# Patient Record
Sex: Female | Born: 1975 | Race: Black or African American | Hispanic: No | Marital: Single | Smoking: Never smoker
Health system: Southern US, Community
[De-identification: ages and names within clinical notes are randomized; demographics above are authoritative.]

## PROBLEM LIST (undated history)

## (undated) DIAGNOSIS — E119 Type 2 diabetes mellitus without complications: Secondary | ICD-10-CM

## (undated) DIAGNOSIS — G510 Bell's palsy: Secondary | ICD-10-CM

## (undated) HISTORY — DX: Bell's palsy: G51.0

## (undated) HISTORY — PX: WISDOM TOOTH EXTRACTION: SHX21

## (undated) HISTORY — DX: Type 2 diabetes mellitus without complications: E11.9

---

## 1999-08-06 ENCOUNTER — Other Ambulatory Visit: Admission: RE | Admit: 1999-08-06 | Discharge: 1999-08-06 | Payer: Self-pay | Admitting: Obstetrics and Gynecology

## 2000-08-18 ENCOUNTER — Other Ambulatory Visit: Admission: RE | Admit: 2000-08-18 | Discharge: 2000-08-18 | Payer: Self-pay | Admitting: Obstetrics and Gynecology

## 2001-04-24 DIAGNOSIS — G510 Bell's palsy: Secondary | ICD-10-CM

## 2001-04-24 HISTORY — DX: Bell's palsy: G51.0

## 2001-10-04 ENCOUNTER — Other Ambulatory Visit: Admission: RE | Admit: 2001-10-04 | Discharge: 2001-10-04 | Payer: Self-pay | Admitting: Obstetrics and Gynecology

## 2002-10-08 ENCOUNTER — Other Ambulatory Visit: Admission: RE | Admit: 2002-10-08 | Discharge: 2002-10-08 | Payer: Self-pay | Admitting: Obstetrics and Gynecology

## 2003-08-25 ENCOUNTER — Other Ambulatory Visit: Admission: RE | Admit: 2003-08-25 | Discharge: 2003-08-25 | Payer: Self-pay | Admitting: Obstetrics and Gynecology

## 2004-04-27 ENCOUNTER — Other Ambulatory Visit: Admission: RE | Admit: 2004-04-27 | Discharge: 2004-04-27 | Payer: Self-pay | Admitting: Obstetrics and Gynecology

## 2004-06-14 ENCOUNTER — Other Ambulatory Visit: Admission: RE | Admit: 2004-06-14 | Discharge: 2004-06-14 | Payer: Self-pay | Admitting: Obstetrics and Gynecology

## 2004-11-04 ENCOUNTER — Other Ambulatory Visit: Admission: RE | Admit: 2004-11-04 | Discharge: 2004-11-04 | Payer: Self-pay | Admitting: Obstetrics and Gynecology

## 2005-06-28 ENCOUNTER — Other Ambulatory Visit: Admission: RE | Admit: 2005-06-28 | Discharge: 2005-06-28 | Payer: Self-pay | Admitting: Obstetrics and Gynecology

## 2005-08-04 ENCOUNTER — Emergency Department (HOSPITAL_COMMUNITY): Admission: EM | Admit: 2005-08-04 | Discharge: 2005-08-04 | Payer: Self-pay | Admitting: Emergency Medicine

## 2006-02-07 ENCOUNTER — Ambulatory Visit: Payer: Self-pay | Admitting: Cardiovascular Disease

## 2006-08-22 ENCOUNTER — Other Ambulatory Visit: Admission: RE | Admit: 2006-08-22 | Discharge: 2006-08-22 | Payer: Self-pay | Admitting: Obstetrics & Gynecology

## 2006-08-25 ENCOUNTER — Encounter: Admission: RE | Admit: 2006-08-25 | Discharge: 2006-08-25 | Payer: Self-pay | Admitting: Obstetrics and Gynecology

## 2007-08-23 ENCOUNTER — Other Ambulatory Visit: Admission: RE | Admit: 2007-08-23 | Discharge: 2007-08-23 | Payer: Self-pay | Admitting: Obstetrics and Gynecology
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
## 2010-06-11 NOTE — Procedures (Signed)
[place] [facility]                              EXERCISE TREADMILL   NAME:Katrina Hanson, Katrina Hanson                      MRN:          [id number]  DATE:02/07/2006                            DOB:          [birth year]    Patient exercised 6 minutes on the Bruce protocol.  The first stage was  advanced.   Maximum heart rate was 164, peak blood pressure was 133/78.   Resting EKG was normal with stress.  There was no evidence of ischemia,  no significant arrhythmias.   IMPRESSION:  Negative exercise stress test at a modest workload.   Patient would not appear to have any significant structural heart  disease or evidence for coronary artery disease.     Noralyn Pick. Eden Emms, MD, [facility]  Electronically Signed    PCN/MedQ  DD: 02/07/2006  DT: 02/07/2006  Job #: [id number]

## 2010-12-28 DIAGNOSIS — J111 Influenza due to unidentified influenza virus with other respiratory manifestations: Secondary | ICD-10-CM | POA: Insufficient documentation

## 2011-07-25 DIAGNOSIS — I8390 Asymptomatic varicose veins of unspecified lower extremity: Secondary | ICD-10-CM | POA: Insufficient documentation

## 2012-05-18 ENCOUNTER — Ambulatory Visit (INDEPENDENT_AMBULATORY_CARE_PROVIDER_SITE_OTHER): Payer: BC Managed Care – PPO

## 2012-05-18 DIAGNOSIS — E559 Vitamin D deficiency, unspecified: Secondary | ICD-10-CM

## 2012-12-06 ENCOUNTER — Encounter: Payer: Self-pay | Admitting: Obstetrics and Gynecology

## 2012-12-06 ENCOUNTER — Encounter: Payer: Self-pay | Admitting: Nurse Practitioner

## 2012-12-06 ENCOUNTER — Ambulatory Visit (INDEPENDENT_AMBULATORY_CARE_PROVIDER_SITE_OTHER): Payer: BC Managed Care – PPO | Admitting: Nurse Practitioner

## 2012-12-06 VITALS — BP 124/82 | HR 68 | Resp 16 | Ht 67.5 in | Wt 218.0 lb

## 2012-12-06 DIAGNOSIS — Z01419 Encounter for gynecological examination (general) (routine) without abnormal findings: Secondary | ICD-10-CM

## 2012-12-06 DIAGNOSIS — Z113 Encounter for screening for infections with a predominantly sexual mode of transmission: Secondary | ICD-10-CM

## 2012-12-06 DIAGNOSIS — E559 Vitamin D deficiency, unspecified: Secondary | ICD-10-CM

## 2012-12-06 DIAGNOSIS — Z0000 Encounter for general adult medical examination without abnormal findings: Secondary | ICD-10-CM

## 2012-12-06 LAB — POCT URINALYSIS DIPSTICK
Nitrite, UA: NEGATIVE
Urobilinogen, UA: NEGATIVE
pH, UA: 7

## 2012-12-06 LAB — HEMOGLOBIN, FINGERSTICK: Hemoglobin, fingerstick: 12.9 g/dL (ref 12.0–16.0)

## 2012-12-06 MED ORDER — NORETHINDRONE 0.35 MG PO TABS: 1.0000 | ORAL_TABLET | Freq: Every day | ORAL | Status: DC

## 2012-12-06 NOTE — Progress Notes (Signed)
Patient ID: Katrina Hanson, female   DOB: 1975/04/12, 37 y.o.   MRN: [id number] 37 y.o. G1P1001 Divorced African American Fe here for annual exam.  She was on OCP until this past month.  She feels very bloated and has weight gain on most OCP.  She is still in same relationship but he lives in [place].  She wants birth control without side effects. Discussed other options.  Patient's last menstrual period was 11/25/2012.          Sexually active: yes  The current method of family planning is OCP (estrogen/progesterone).    Exercising: yes  Home and gym routine that includes jogging and weights at least 2 times per week Smoker:  no  Health Maintenance: Pap: 01/20/12, WNL MMG: 08/25/06, Diagnostic Bilat: Bi-Rads 1: negative TDaP: 2009 Labs: HB: 12.9 Urine: negative   reports that she has never smoked. She has never used smokeless tobacco. She reports that she drinks alcohol. She reports that she does not use illicit drugs.  Past Medical History  Diagnosis Date  . Bell's palsy 4/03  . Diabetes mellitus without complication     borderline    Past Surgical History  Procedure Laterality Date  . Cesarean section  8/95    breech  . Wisdom tooth extraction  age 37    Current Outpatient Prescriptions  Medication Sig Dispense Refill  . vitamin D, CHOLECALCIFEROL, 400 UNITS tablet Take 800 Units by mouth daily.      . norethindrone (MICRONOR,CAMILA,ERRIN) 0.35 MG tablet Take 1 tablet (0.35 mg total) by mouth daily.  3 Package  3   No current facility-administered medications for this visit.    Family History  Problem Relation Age of Onset  . Pancreatic cancer Mother   . Hypertension Father   . Stroke Maternal Grandmother     ROS:  Pertinent items are noted in HPI.  Otherwise, a comprehensive ROS was negative.  Exam:   BP 124/82  Pulse 68  Resp 16  Ht 5' 7.5" (1.715 m)  Wt 218 lb (98.884 kg)  BMI 33.62 kg/m2  LMP 11/25/2012 Height: 5' 7.5" (171.5 cm)  Ht Readings from Last  3 Encounters:  12/06/12 5' 7.5" (1.715 m)    General appearance: alert, cooperative and appears stated age Head: Normocephalic, without obvious abnormality, atraumatic Neck: no adenopathy, supple, symmetrical, trachea midline and thyroid normal to inspection and palpation Lungs: clear to auscultation bilaterally Breasts: normal appearance, no masses or tenderness Heart: regular rate and rhythm Abdomen: soft, non-tender; no masses,  no organomegaly Extremities: extremities normal, atraumatic, no cyanosis or edema Skin: Skin color, texture, turgor normal. No rashes or lesions Lymph nodes: Cervical, supraclavicular, and axillary nodes normal. No abnormal inguinal nodes palpated Neurologic: Grossly normal   Pelvic: External genitalia:  no lesions              Urethra:  normal appearing urethra with no masses, tenderness or lesions              Bartholin's and Skene's: normal                 Vagina: normal appearing vagina with normal color and discharge, no lesions              Cervix: anteverted              Pap taken: yes Bimanual Exam:  Uterus:  normal size, contour, position, consistency, mobility, non-tender  Adnexa: no mass, fullness, tenderness               Rectovaginal: Confirms               Anus:  normal sphincter tone, no lesions  A:  Well Woman with normal exam  R/O STD's  OCP with weight gain and bloating  History of menorrhagia  Vit D Deficiency  Borderline DM   P:   Pap smear as per guidelines done today   Will change to POP with Micronor - discussed use, BUM, and side effects.  Will follow with labs including Vit D  Counseled on breast self exam, adequate intake of calcium and vitamin D, diet and exercise return annually or prn  An After Visit Summary was printed and given to the patient.

## 2012-12-06 NOTE — Patient Instructions (Signed)

## 2012-12-08 LAB — STD PANEL: Hepatitis B Surface Ag: NEGATIVE

## 2012-12-09 NOTE — Progress Notes (Signed)
Encounter reviewed by Dr. Brook Silva.  

## 2012-12-11 LAB — IPS PAP TEST WITH HPV

## 2012-12-11 LAB — IPS N GONORRHOEA AND CHLAMYDIA BY PCR

## 2012-12-17 ENCOUNTER — Other Ambulatory Visit: Payer: Self-pay | Admitting: Nurse Practitioner

## 2012-12-17 MED ORDER — VITAMIN D (ERGOCALCIFEROL) 1.25 MG (50000 UNIT) PO CAPS: 50000.0000 [IU] | ORAL_CAPSULE | ORAL | Status: DC

## 2012-12-17 NOTE — Progress Notes (Signed)
RX sent per result note.

## 2013-11-25 ENCOUNTER — Encounter: Payer: Self-pay | Admitting: Nurse Practitioner

## 2013-12-10 ENCOUNTER — Ambulatory Visit: Payer: BC Managed Care – PPO | Admitting: Nurse Practitioner

## 2013-12-16 ENCOUNTER — Ambulatory Visit (INDEPENDENT_AMBULATORY_CARE_PROVIDER_SITE_OTHER): Payer: BC Managed Care – PPO | Admitting: Nurse Practitioner

## 2013-12-16 ENCOUNTER — Encounter: Payer: Self-pay | Admitting: Nurse Practitioner

## 2013-12-16 VITALS — BP 118/80 | HR 66 | Resp 16 | Ht 68.0 in | Wt 230.8 lb

## 2013-12-16 DIAGNOSIS — Z01419 Encounter for gynecological examination (general) (routine) without abnormal findings: Secondary | ICD-10-CM

## 2013-12-16 DIAGNOSIS — Z0000 Encounter for general adult medical examination without abnormal findings: Secondary | ICD-10-CM

## 2013-12-16 DIAGNOSIS — Z113 Encounter for screening for infections with a predominantly sexual mode of transmission: Secondary | ICD-10-CM

## 2013-12-16 LAB — HEMOGLOBIN, FINGERSTICK: Hemoglobin, fingerstick: 12.1 g/dL (ref 12.0–16.0)

## 2013-12-16 LAB — POCT URINALYSIS DIPSTICK
BILIRUBIN UA: NEGATIVE
GLUCOSE UA: NEGATIVE
KETONES UA: NEGATIVE
Leukocytes, UA: NEGATIVE
Nitrite, UA: NEGATIVE
PROTEIN UA: NEGATIVE
RBC UA: NEGATIVE
UROBILINOGEN UA: NEGATIVE
pH, UA: 5

## 2013-12-16 NOTE — Patient Instructions (Signed)

## 2013-12-16 NOTE — Progress Notes (Signed)
Patient ID: Katrina Hanson, female   DOB: 10/27/1975, 38 y.o.   MRN: [id number] 38 y.o. G1P1001 Married African American Fe here for annual exam.  Now off POP since 07/2013 secondary to increase of weight and bloating.  Now menses are very regular at 28 day, light at 4 days. Partner in [place] and sees each other every 4-5 months.     Patient's last menstrual period was 12/08/2013 (exact date).          Sexually active: yes  The current method of family planning is condoms most times.  Exercising: yes Home and gym routine that includes cardio and weights at least 2 times per week Smoker: no  Health Maintenance: Pap: 12/06/12, negative with neg HR HPV MMG: 08/25/06, Diagnostic Bilat: Bi-Rads 1: negative TDaP: 2009 Labs:  HB:  12.1 Urine:  Neg   reports that she has never smoked. She has never used smokeless tobacco. She reports that she drinks alcohol. She reports that she does not use illicit drugs.  Past Medical History  Diagnosis Date  . Bell's palsy 4/03  . Diabetes mellitus without complication     borderline    Past Surgical History  Procedure Laterality Date  . Cesarean section  8/95    breech  . Wisdom tooth extraction  age 38    No current outpatient prescriptions on file.   No current facility-administered medications for this visit.    Family History  Problem Relation Age of Onset  . Pancreatic cancer Mother   . Hypertension Father   . Stroke Maternal Grandmother     ROS:  Pertinent items are noted in HPI.  Otherwise, a comprehensive ROS was negative.  Exam:   BP 118/80 mmHg  Pulse 66  Resp 16  Ht 5\' 8"  (1.727 m)  Wt 230 lb 12.8 oz (104.69 kg)  BMI 35.10 kg/m2  LMP 12/08/2013 (Exact Date) Height: 5\' 8"  (172.7 cm)  Ht Readings from Last 3 Encounters:  12/16/13 5\' 8"  (1.727 m)  12/06/12 5' 7.5" (1.715 m)    General appearance: alert, cooperative and appears stated age Head: Normocephalic, without obvious abnormality, atraumatic Neck: no adenopathy,  supple, symmetrical, trachea midline and thyroid normal to inspection and palpation Lungs: clear to auscultation bilaterally Breasts: normal appearance, no masses or tenderness Heart: regular rate and rhythm Abdomen: soft, non-tender; no masses,  no organomegaly Extremities: extremities normal, atraumatic, no cyanosis or edema Skin: Skin color, texture, turgor normal. No rashes or lesions Lymph nodes: Cervical, supraclavicular, and axillary nodes normal. No abnormal inguinal nodes palpated Neurologic: Grossly normal   Pelvic: External genitalia:  no lesions              Urethra:  normal appearing urethra with no masses, tenderness or lesions              Bartholin's and Skene's: normal                 Vagina: normal appearing vagina with normal color and light brown vaginal discharge from end of menses, no lesions              Cervix: anteverted              Pap taken: Yes.   Bimanual Exam:  Uterus:  normal size, contour, position, consistency, mobility, non-tender              Adnexa: no mass, fullness, tenderness               Rectovaginal:  Confirms               Anus:  normal sphincter tone, no lesions  A:  Well Woman with normal exam  R/O STD's OCP & POP with weight gain and bloating History of menorrhagia Vit D Deficiency Borderline DM  P:   Reviewed health and wellness pertinent to exam  Pap smear taken today  Will follow with labs and pap  Counseled on breast self exam, STD prevention, adequate intake of calcium and vitamin D, diet and exercise return annually or prn  An After Visit Summary was printed and given to the patient.

## 2013-12-17 LAB — STD PANEL
HIV: NONREACTIVE
Hepatitis B Surface Ag: NEGATIVE

## 2013-12-18 NOTE — Progress Notes (Signed)
Reviewed personally.  M. Suzanne Lomax Poehler, MD.  

## 2013-12-20 LAB — IPS PAP TEST WITH HPV

## 2013-12-20 LAB — IPS N GONORRHOEA AND CHLAMYDIA BY PCR

## 2013-12-23 ENCOUNTER — Telehealth: Payer: Self-pay | Admitting: Nurse Practitioner

## 2013-12-23 MED ORDER — VITAMIN D (ERGOCALCIFEROL) 1.25 MG (50000 UNIT) PO CAPS: 50000.0000 [IU] | ORAL_CAPSULE | ORAL | Status: DC

## 2013-12-23 NOTE — Telephone Encounter (Signed)
Patient calling with questions for nurse about why more labs were not drawn at her last visit.

## 2013-12-23 NOTE — Telephone Encounter (Signed)
Spoke with patient. Patient states that she was called this morning with results from lab work. "I usually have my cholesterol, thyroid, vitamin D and hemoglobin A1c checked every year. I am not sure why these were not checked this year. My Vitamin D has been low in the past so this should have been checked." Patient is not on current Vitamin D medication. "I go on it and come off it depending on the levels." Offered patient to schedule another lab appointment to have these levels checked but patient declines. "I live in [place] and these are usually covered under my annual." Advised patient will need to speak with Lauro FranklinPatricia Rolen-Grubb, FNP regarding labs and return call with further recommendations. Patient is agreeable. Patient was seen for aex on 11/23.

## 2013-12-23 NOTE — Addendum Note (Signed)
Addended by: Roanna BanningGRUBB, Trenise Turay R on: 12/23/2013 05:24 PM   Modules accepted: Orders

## 2013-12-23 NOTE — Telephone Encounter (Signed)
Spoke with Katrina Hanson in the lab who called to see if labs could be added per Lauro FranklinPatricia Rolen-Grubb, FNP to order from 11/23. Blood has already been discarded from 11/23. Patient will need to be seen for another lab visit.

## 2013-12-24 NOTE — Telephone Encounter (Signed)
Will continue with Vit D weekly and hopefully she will be interested in labs at a later date.

## 2013-12-24 NOTE — Telephone Encounter (Signed)
Spoke with patient. Advised patient orders for labs have been placed by Lauro FranklinPatricia Rolen-Grubb, FNP from date of aex. Advised patient can be in seen in our office for lab visit to have these labs checked or is able to be [facility] at [facility] lab in her area. Advised this way labs will be run through aex per Elease HashimotoPatricia Rolen-Grubb, FNP. "I am just not interested in having to come in to have these done again. It is disappointing because I changed my diet and it would be nice to know what the levels are. I had enough blood taken last week. I don't want to go any where for more to be taken. Why do I have a Vitamin D prescription if my Vitamin D was not checked?" Advised Lauro FranklinPatricia Rolen-Grubb, FNP placed order for patient to continue Vitamin D based on results from last annual until recent Vitamin D could be drawn. Advised of importance of follow up with Vitamin D level. Patient declines to schedule.  Lauro FranklinPatricia Rolen-Grubb, FNP please advise.

## 2013-12-25 NOTE — Telephone Encounter (Signed)
Patient previously advised to continue vitamin D weekly until level can be rechecked.  Routing to provider for final review. Patient agreeable to disposition. Will close encounter

## 2014-01-23 ENCOUNTER — Other Ambulatory Visit: Payer: Self-pay

## 2014-01-23 NOTE — Telephone Encounter (Signed)
Medication refill request: Vitamin D Last AEX:  12/16/13  Next AEX: NS Last MMG (if hormonal medication request): NA Refill authorized: pharmacy is requesting a 90 day supply per INS

## 2014-01-27 MED ORDER — VITAMIN D (ERGOCALCIFEROL) 1.25 MG (50000 UNIT) PO CAPS: 50000.0000 [IU] | ORAL_CAPSULE | ORAL | Status: DC

## 2014-01-27 NOTE — Telephone Encounter (Signed)
Rx sent 12/23/13 #30/ 2Refills. Incoming fax requesting 90 day supply. = #90tabs/0refills  Approve?

## 2014-01-27 NOTE — Addendum Note (Signed)
Addended by: Dion Body on: 01/27/2014 08:44 AM   Modules accepted: Orders

## 2014-12-22 ENCOUNTER — Ambulatory Visit (INDEPENDENT_AMBULATORY_CARE_PROVIDER_SITE_OTHER): Payer: BLUE CROSS/BLUE SHIELD | Admitting: Nurse Practitioner

## 2014-12-22 ENCOUNTER — Encounter: Payer: Self-pay | Admitting: Nurse Practitioner

## 2014-12-22 VITALS — BP 118/70 | HR 62 | Resp 16 | Ht 67.0 in | Wt 225.0 lb

## 2014-12-22 DIAGNOSIS — Z0000 Encounter for general adult medical examination without abnormal findings: Secondary | ICD-10-CM | POA: Diagnosis not present

## 2014-12-22 DIAGNOSIS — Z113 Encounter for screening for infections with a predominantly sexual mode of transmission: Secondary | ICD-10-CM

## 2014-12-22 DIAGNOSIS — Z01419 Encounter for gynecological examination (general) (routine) without abnormal findings: Secondary | ICD-10-CM | POA: Diagnosis not present

## 2014-12-22 NOTE — Patient Instructions (Signed)

## 2014-12-22 NOTE — Progress Notes (Signed)
39 y.o. G1P1001 Single African American Fe here for annual exam.  Menses is normal, heavy X 1 day.  Changing super pad every 2-3 hours.  Partner still long distance work in [place] and sees him every 4-7 months.  She still does not want hormonal intervention for menses.  Patient's last menstrual period was 11/29/2014.          Sexually active: Yes.    The current method of family planning is condoms most of the time.    Exercising: Yes.    Cardio, weight lifting  Smoker:  no  Health Maintenance: Pap:  12/16/13 Neg. HR HPV:neg MMG:  08/25/2006 BIRADS1:Neg TDaP:  10/2014 by PCP Labs: PCP - last Vit D at PCP was 20 - a month ago.   reports that she has never smoked. She has never used smokeless tobacco. She reports that she drinks about 0.6 oz of alcohol per week. She reports that she does not use illicit drugs.  Past Medical History  Diagnosis Date  . Bell's palsy 4/03  . Diabetes mellitus without complication (HCC)     borderline    Past Surgical History  Procedure Laterality Date  . Cesarean section  8/95    breech  . Wisdom tooth extraction  age 39    Current Outpatient Prescriptions  Medication Sig Dispense Refill  . cyclobenzaprine (FLEXERIL) 10 MG tablet TK 1 T PO  TID PRN FOR MUSCLE SPASMS FOR UP TO 7 DAYS  0  . DENTA 5000 PLUS 1.1 % CREA dental cream See admin instructions.  12  . mometasone (NASONEX) 50 MCG/ACT nasal spray once daily as needed.    . naproxen sodium (RA NAPROXEN SODIUM) 220 MG tablet Take by mouth.     No current facility-administered medications for this visit.    Family History  Problem Relation Age of Onset  . Pancreatic cancer Mother   . Hypertension Father   . Stroke Maternal Grandmother     ROS:  Pertinent items are noted in HPI.  Otherwise, a comprehensive ROS was negative.  Exam:   BP 118/70 mmHg  Pulse 62  Resp 16  Ht 5\' 7"  (1.702 m)  Wt 225 lb (102.059 kg)  BMI 35.23 kg/m2  LMP 11/29/2014 Height: 5\' 7"  (170.2 cm) Ht Readings from  Last 3 Encounters:  12/22/14 5\' 7"  (1.702 m)  12/16/13 5\' 8"  (1.727 m)  12/06/12 5' 7.5" (1.715 m)    General appearance: alert, cooperative and appears stated age Head: Normocephalic, without obvious abnormality, atraumatic Neck: no adenopathy, supple, symmetrical, trachea midline and thyroid normal to inspection and palpation Lungs: clear to auscultation bilaterally Breasts: normal appearance, no masses or tenderness Heart: regular rate and rhythm Abdomen: soft, non-tender; no masses,  no organomegaly Extremities: extremities normal, atraumatic, no cyanosis or edema Skin: Skin color, texture, turgor normal. No rashes or lesions Lymph nodes: Cervical, supraclavicular, and axillary nodes normal. No abnormal inguinal nodes palpated Neurologic: Grossly normal   Pelvic: External genitalia:  no lesions              Urethra:  normal appearing urethra with no masses, tenderness or lesions              Bartholin's and Skene's: normal                 Vagina: normal appearing vagina with normal color and discharge, no lesions              Cervix: anteverted  Pap taken: No. Bimanual Exam:  Uterus:  normal size, contour, position, consistency, mobility, non-tender              Adnexa: no mass, fullness, tenderness               Rectovaginal: Confirms               Anus:  normal sphincter tone, no lesions  Chaperone present: yes  A:  Well Woman with normal exam  R/O STD's OCP & POP with weight gain and bloating History of menorrhagia Vit D Deficiency Borderline DM   P:   Reviewed health and wellness pertinent to exam  Pap smear as above  Mammogram is due age 28  Will follow up with labs  Counseled on breast self exam, adequate intake of calcium and vitamin D, diet and exercise return annually or prn  An After Visit Summary was printed and given to the patient.

## 2014-12-23 LAB — STD PANEL
HEP B S AG: NEGATIVE
HIV: NONREACTIVE

## 2014-12-24 LAB — IPS N GONORRHOEA AND CHLAMYDIA BY PCR

## 2014-12-24 NOTE — Progress Notes (Signed)
Encounter reviewed by Dr. Brook Amundson C. Silva.  

## 2014-12-29 ENCOUNTER — Telehealth: Payer: Self-pay | Admitting: Nurse Practitioner

## 2014-12-29 NOTE — Telephone Encounter (Signed)
Left message to call Donda Friedli at [phone]. 

## 2014-12-29 NOTE — Telephone Encounter (Signed)
Patient is ready for a new birth control prescription. Patient wants the same prescription that she had in the past. Patient said she talked with Shirlyn GoltzPatty Grubb, FNP about this at her last visit. Confirmed pharmacy with patient.

## 2014-12-30 NOTE — Telephone Encounter (Signed)
A combination pill will give her better coverage for birth control.  Also she will have less menses flow and less PMS symptoms.  If she took Lo Loestrin this is the lowest estrogen level and she may also get amenorrhea.  If she is comfortable with this.  She could certainly try and report back if she does not like.

## 2014-12-30 NOTE — Telephone Encounter (Signed)
Patient is returning a call to Kaitlyn. °

## 2014-12-30 NOTE — Telephone Encounter (Signed)
Spoke with patient. Patient states that she would like to restart on birth control pills at this time. Has been on estrogen and progesterone combination pills and POP in the past. States that she had weight gain with both types of pill. Patient is requesting Ria CommentPatricia Grubb, FNP recommendation on which pill to restart. Asking if there is any added benefit to having an estrogen containing birth control. Advised I will speak with Ria CommentPatricia Grubb, FNP regarding her recommendations and return call with further information. Patient is agreeable. "Whatever she recommends can be sent to the pharmacy. I trust what she thinks."

## 2014-12-31 NOTE — Telephone Encounter (Signed)
Left message to call Marilu Rylander at [phone]. 

## 2015-01-01 MED ORDER — NORETHIN-ETH ESTRAD-FE BIPHAS 1 MG-10 MCG / 10 MCG PO TABS: 1.0000 | ORAL_TABLET | Freq: Every day | ORAL | Status: DC

## 2015-01-01 NOTE — Telephone Encounter (Signed)
Returning a call to Kaitlyn. °

## 2015-01-01 NOTE — Telephone Encounter (Signed)
Spoke with patient. Advised patient of message as seen below from Ria CommentPatricia Grubb, FNP. Patient is agreeable and verbalizes understanding. Would like to try Lo Loestrin at this time. Rx for Lo Loestrin #3 0RF sent to pharmacy on file. Patient will try this new OCP and provide Ria CommentPatricia Grubb, FNP with an update on how she is doing.  Routing to provider for final review. Patient agreeable to disposition. Will close encounter.

## 2015-03-26 ENCOUNTER — Other Ambulatory Visit: Payer: Self-pay | Admitting: Nurse Practitioner

## 2015-03-26 NOTE — Telephone Encounter (Signed)
eScribe request from [facility] for refill on LO-LOESTRIN Last filled - 01/01/15, #3 X 0 REFILL Last AEX - 12/22/14 Next AEX - not scheduled  Pt was given 3 month supply to try following last AEX.    I spoke to the patient. She has actually not started medication yet.  She is waiting for her cycle to start.  She has 3 month supply at home.  She will take this 3 months and call our office with update.  She will call sooner if she is having side effects she does not like.    RX denied at pharmacy.

## 2015-12-23 ENCOUNTER — Encounter: Payer: Self-pay | Admitting: Nurse Practitioner

## 2015-12-23 ENCOUNTER — Ambulatory Visit (INDEPENDENT_AMBULATORY_CARE_PROVIDER_SITE_OTHER): Payer: BLUE CROSS/BLUE SHIELD | Admitting: Nurse Practitioner

## 2015-12-23 VITALS — BP 110/70 | HR 68 | Resp 16 | Ht 67.0 in | Wt 227.0 lb

## 2015-12-23 DIAGNOSIS — Z0000 Encounter for general adult medical examination without abnormal findings: Secondary | ICD-10-CM | POA: Diagnosis not present

## 2015-12-23 DIAGNOSIS — Z01419 Encounter for gynecological examination (general) (routine) without abnormal findings: Secondary | ICD-10-CM | POA: Diagnosis not present

## 2015-12-23 DIAGNOSIS — Z113 Encounter for screening for infections with a predominantly sexual mode of transmission: Secondary | ICD-10-CM | POA: Diagnosis not present

## 2015-12-23 LAB — POCT URINALYSIS DIPSTICK
Bilirubin, UA: NEGATIVE
Blood, UA: NEGATIVE
Glucose, UA: NEGATIVE
Ketones, UA: NEGATIVE
LEUKOCYTES UA: NEGATIVE
NITRITE UA: NEGATIVE
PH UA: 5
PROTEIN UA: NEGATIVE
UROBILINOGEN UA: NEGATIVE

## 2015-12-23 MED ORDER — LEVONORGESTREL-ETHINYL ESTRAD 0.1-20 MG-MCG PO TABS: 1.0000 | ORAL_TABLET | Freq: Every day | ORAL | 4 refills | Status: AC

## 2015-12-23 MED ORDER — VITAMIN D (ERGOCALCIFEROL) 1.25 MG (50000 UNIT) PO CAPS: 50000.0000 [IU] | ORAL_CAPSULE | ORAL | 3 refills | Status: AC

## 2015-12-23 NOTE — Patient Instructions (Signed)

## 2015-12-23 NOTE — Progress Notes (Signed)
Patient ID: Katrina Hanson, female   DOB: 05/20/1975, 40 y.o.   MRN: [id number]  40 y.o. G1P1001 Single  African American Fe here for annual exam.  [place]  Menses still regular.  Last for 4 days, then spotting only with wiping for another week.  Cramps are better with Vit E and Aleve prn.  She wants to try OCP again but did not like Loestrin secondary to bloating and weight gain. Labs per PCP shows a low Vit d of 21, normal CBC and HGB AIC of 6.0.  Patient's last menstrual period was 12/15/2015.          Sexually active: Yes.    The current method of family planning is condoms most of the time.    Exercising: Yes.    cardio Smoker:  no  Health Maintenance: Pap: 12/16/13 Neg. HR HPV:neg MMG: 08/25/2006 BIRADS1:Neg TDaP: 10/2014 by PCP HIV: 12/22/14 Labs: PCP does all labs      Urine today: Normal   reports that she has never smoked. She has never used smokeless tobacco. She reports that she drinks about 0.6 oz of alcohol per week . She reports that she does not use drugs.  Past Medical History:  Diagnosis Date  . Bell's palsy 4/03  . 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 CESAREAN SECTION  8/95   breech  . WISDOM TOOTH EXTRACTION  age 40    Current Outpatient Prescriptions  Medication Sig Dispense Refill  . mometasone (NASONEX) 50 MCG/ACT nasal spray once daily as needed.    . naproxen sodium (RA NAPROXEN SODIUM) 220 MG tablet Take by mouth.    . Pseudoephedrine HCl (SUDAFED PO) Take by mouth as needed.    Marland Kitchen. levonorgestrel-ethinyl estradiol (AVIANE,ALESSE,LESSINA) 0.1-20 MG-MCG tablet Take 1 tablet by mouth daily. 3 Package 4  . Vitamin D, Ergocalciferol, (DRISDOL) 50000 units CAPS capsule Take 1 capsule (50,000 Units total) by mouth every 7 (seven) days. 30 capsule 3   No current facility-administered medications for this visit.     Family History  Problem Relation Age of  Onset  . Pancreatic cancer Mother   . Hypertension Father   . Stroke Maternal Grandmother     ROS:  Pertinent items are noted in HPI.  Otherwise, a comprehensive ROS was negative.  Exam:   BP 110/70 (BP Location: Right Arm, Patient Position: Sitting, Cuff Size: Normal)   Pulse 68   Resp 16   Ht 5\' 7"  (1.702 m)   Wt 227 lb (103 kg)   LMP 12/15/2015   BMI 35.55 kg/m  Height: 5\' 7"  (170.2 cm) Ht Readings from Last 3 Encounters:  12/23/15 5\' 7"  (1.702 m)  12/22/14 5\' 7"  (1.702 m)  12/16/13 5\' 8"  (1.727 m)    General appearance: alert, cooperative and appears stated age Head: Normocephalic, without obvious abnormality, atraumatic Neck: no adenopathy, supple, symmetrical, trachea midline and thyroid normal to inspection and palpation Lungs: clear to auscultation bilaterally Breasts: normal appearance, no masses or tenderness Heart: regular rate and rhythm Abdomen: soft, non-tender; no masses,  no organomegaly Extremities: extremities normal, atraumatic, no cyanosis or edema Skin: Skin color, texture, turgor normal. No rashes or lesions Lymph nodes: Cervical, supraclavicular, and axillary nodes normal. No abnormal inguinal nodes palpated Neurologic: Grossly normal   Pelvic: External genitalia:  no lesions              Urethra:  normal  appearing urethra with no masses, tenderness or lesions              Bartholin's and Skene's: normal                 Vagina: normal appearing vagina with normal color and discharge, no lesions              Cervix: anteverted              Pap taken: Yes.   Bimanual Exam:  Uterus:  normal size, contour, position, consistency, mobility, non-tender              Adnexa: no mass, fullness, tenderness               Rectovaginal: Confirms               Anus:  normal sphincter tone, no lesions  Chaperone present: yes  A:  Well Woman with normal exam  Condoms for birth control  R/O STD's OCP & POP with weight gain and  bloating History of menorrhagia Vit D Deficiency Borderline DM   P:   Reviewed health and wellness pertinent to exam  Pap smear as above  Mammogram is due now and she will schedule  Will try her on Alesse 20 mg pill and see how she does - if does not like to CB.  Will follow with labs and pap  Refill on Vit D 50000 IU weekly  Counseled on breast self exam, mammography screening, use and side effects of OCP's, adequate intake of calcium and vitamin D, diet and exercise, Kegel's exercises return annually or prn  An After Visit Summary was printed and given to the patient.

## 2015-12-24 LAB — GC/CHLAMYDIA PROBE AMP
CT Probe RNA: NOT DETECTED
GC PROBE AMP APTIMA: NOT DETECTED

## 2015-12-24 LAB — STD PANEL
HIV 1&2 Ab, 4th Generation: NONREACTIVE
Hepatitis B Surface Ag: NEGATIVE

## 2015-12-24 NOTE — Progress Notes (Signed)
Reviewed personally.  M. Suzanne Mance Vallejo, MD.  

## 2015-12-25 ENCOUNTER — Telehealth: Payer: Self-pay | Admitting: *Deleted

## 2015-12-25 NOTE — Telephone Encounter (Signed)
I have attempted to contact this patient by phone with the following results: left message to return call to SocorroStephanie at [phone] on answering machine (mobile).  No personal information given, no permissions given per DPR.  [phone] [facility](Mobile)

## 2015-12-25 NOTE — Telephone Encounter (Signed)
-----   Message from Verner Choleborah S Leonard, CNM sent at 12/24/2015 10:21 AM EST ----- Notify patient that HIV , Hep B, RPR, GC, Chlamydia are all negative

## 2015-12-28 LAB — IPS PAP TEST WITH HPV

## 2015-12-29 NOTE — Telephone Encounter (Signed)
Pt notified in result note.  Closing encounter. 

## 2016-01-22 ENCOUNTER — Telehealth: Payer: Self-pay | Admitting: Nurse Practitioner

## 2016-01-22 NOTE — Telephone Encounter (Signed)
Spoke with patient, advised as seen below per Ria CommentPatricia Grubb, NP. Patient states she currently takes a daily vitamin that contains 1000 iu of Vit D that she tolerates well. Patient states in the past she has taken an additional 2000 iu daily, is this ok to try? Advised patient will update Ria CommentPatricia Grubb, NP and clarify dosage and return call, patient is agreeable.  Ria CommentPatricia Grubb, NP -ok for patient to add 2000 iu daily to the 1000 iu daily that is in multivitamin?

## 2016-01-22 NOTE — Telephone Encounter (Signed)
Spoke with patient. Patient states she was seen for AEX 12/23/15 and restarted Vit D 50,000iu weekly. Patient states she is into week 3 and is experiencing nausea, increased thirst, dizziness and feeling tired. Patient states these symptoms last for 2-3 days after taking the Vit D. Patient states her diet has changed in the last 2 years to only seafood instead of meat and wasn't sure if this was effecting her. Patient states last Vit D was drawn in Nov 2017 at pcp and copy of results provided at [facility]. Advised patient no recent labs available in chart. Will review with Ria CommentPatricia Grubb, NP and return call with recommendations, patient is agreeable.   Ria CommentPatricia Grubb, NP -please advise?

## 2016-01-22 NOTE — Telephone Encounter (Signed)
Spoke with patient, advised as seen below per Patricia Grubb, NP. Patient verbalizes understanding and is agreeable.  Routing to provider for final review. Patient is agreeable to disposition. Will close encounter.  

## 2016-01-22 NOTE — Telephone Encounter (Signed)
Patient states she has been on the Vitamin D3 and has had 2 doses of it.  States it makes her nauseous and thirsty for 2-3 days after taking it.

## 2016-01-22 NOTE — Telephone Encounter (Signed)
Yes if she tolerates 1000 IU in Multi vitamin, she can add the extra 2000 IU OTCespecially during winter months.

## 2016-01-22 NOTE — Telephone Encounter (Signed)
Her Vit D at PCP was 21 - so she does need Vit D.  But we can change her to 1000 IU daily for about 1 week, then go up to 2000 IU daily.  If she tolerates this then continue.  If she does not tolerate then go back to 1000 IU daily.
